# Patient Record
Sex: Female | Born: 1960 | Race: White | Hispanic: No | Marital: Married
Health system: Southern US, Academic
[De-identification: ages and names within clinical notes are randomized; demographics above are authoritative.]

## PROBLEM LIST (undated history)

## (undated) ENCOUNTER — Telehealth

## (undated) ENCOUNTER — Encounter
Attending: Rehabilitative and Restorative Service Providers" | Primary: Rehabilitative and Restorative Service Providers"

## (undated) ENCOUNTER — Ambulatory Visit

## (undated) ENCOUNTER — Encounter
Attending: Student in an Organized Health Care Education/Training Program | Primary: Student in an Organized Health Care Education/Training Program

## (undated) ENCOUNTER — Encounter

## (undated) ENCOUNTER — Ambulatory Visit
Payer: BLUE CROSS/BLUE SHIELD | Attending: Rehabilitative and Restorative Service Providers" | Primary: Rehabilitative and Restorative Service Providers"

## (undated) ENCOUNTER — Encounter: Attending: Adult Health | Primary: Adult Health

## (undated) ENCOUNTER — Ambulatory Visit: Payer: Medicaid (Managed Care)

## (undated) ENCOUNTER — Ambulatory Visit: Payer: PRIVATE HEALTH INSURANCE

## (undated) ENCOUNTER — Ambulatory Visit: Payer: BLUE CROSS/BLUE SHIELD

## (undated) ENCOUNTER — Telehealth: Attending: Ambulatory Care | Primary: Ambulatory Care

## (undated) ENCOUNTER — Encounter: Attending: Family Medicine | Primary: Family Medicine

---
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
## 2006-08-15 ENCOUNTER — Ambulatory Visit: Payer: Self-pay | Admitting: Internal Medicine

## 2011-09-05 ENCOUNTER — Ambulatory Visit: Payer: Self-pay | Admitting: Family Medicine

## 2011-09-06 ENCOUNTER — Ambulatory Visit: Payer: Self-pay | Admitting: Family Medicine

## 2016-01-05 ENCOUNTER — Other Ambulatory Visit (INDEPENDENT_AMBULATORY_CARE_PROVIDER_SITE_OTHER): Payer: Self-pay | Admitting: Vascular Surgery

## 2016-01-05 DIAGNOSIS — I83813 Varicose veins of bilateral lower extremities with pain: Secondary | ICD-10-CM

## 2016-01-06 ENCOUNTER — Encounter (INDEPENDENT_AMBULATORY_CARE_PROVIDER_SITE_OTHER): Payer: Self-pay

## 2016-01-06 ENCOUNTER — Ambulatory Visit (INDEPENDENT_AMBULATORY_CARE_PROVIDER_SITE_OTHER): Payer: BLUE CROSS/BLUE SHIELD

## 2016-01-06 ENCOUNTER — Ambulatory Visit (INDEPENDENT_AMBULATORY_CARE_PROVIDER_SITE_OTHER): Payer: Self-pay | Admitting: Vascular Surgery

## 2016-01-06 DIAGNOSIS — I83813 Varicose veins of bilateral lower extremities with pain: Secondary | ICD-10-CM

## 2016-03-08 ENCOUNTER — Other Ambulatory Visit: Payer: Self-pay | Admitting: Physician Assistant

## 2016-03-08 DIAGNOSIS — R1011 Right upper quadrant pain: Secondary | ICD-10-CM

## 2016-03-15 ENCOUNTER — Ambulatory Visit: Payer: BLUE CROSS/BLUE SHIELD

## 2016-07-20 ENCOUNTER — Emergency Department
Admission: EM | Admit: 2016-07-20 | Discharge: 2016-07-20 | Disposition: A | Discharge status: Home / Self Care | Source: Intra-hospital

## 2016-07-20 ENCOUNTER — Emergency Department
Admission: EM | Admit: 2016-07-20 | Discharge: 2016-07-20 | Disposition: A | Discharge status: Home / Self Care | Source: Intra-hospital | Attending: Emergency Medicine | Admitting: Emergency Medicine

## 2016-07-20 DIAGNOSIS — R1011 Right upper quadrant pain: Principal | ICD-10-CM

## 2016-07-20 MED ORDER — NITROFURANTOIN MONOHYDRATE/MACROCRYSTALS 100 MG CAPSULE
ORAL_CAPSULE | Freq: Two times a day (BID) | ORAL | 0 refills | 0.00000 days | Status: CP
Start: 2016-07-20 — End: 2016-07-27

## 2018-01-01 ENCOUNTER — Ambulatory Visit
Admit: 2018-01-01 | Discharge: 2018-01-01 | Disposition: A | Discharge status: Home / Self Care | Payer: PRIVATE HEALTH INSURANCE

## 2018-01-01 MED ORDER — DICLOFENAC 1 % TOPICAL GEL
Freq: Four times a day (QID) | TOPICAL | 11 refills | 0.00000 days | Status: CP
Start: 2018-01-01 — End: 2018-06-28

## 2018-02-02 ENCOUNTER — Ambulatory Visit: Admit: 2018-02-02 | Discharge: 2018-02-03

## 2018-02-02 DIAGNOSIS — Z1231 Encounter for screening mammogram for malignant neoplasm of breast: Principal | ICD-10-CM

## 2018-06-28 ENCOUNTER — Telehealth: Admit: 2018-06-28 | Discharge: 2018-06-29

## 2018-06-28 DIAGNOSIS — R899 Unspecified abnormal finding in specimens from other organs, systems and tissues: Secondary | ICD-10-CM

## 2018-06-28 DIAGNOSIS — R52 Pain, unspecified: Secondary | ICD-10-CM

## 2018-06-28 DIAGNOSIS — M2560 Stiffness of unspecified joint, not elsewhere classified: Secondary | ICD-10-CM

## 2018-06-28 DIAGNOSIS — M1990 Unspecified osteoarthritis, unspecified site: Principal | ICD-10-CM

## 2018-06-28 DIAGNOSIS — G8929 Other chronic pain: Secondary | ICD-10-CM

## 2018-08-02 ENCOUNTER — Ambulatory Visit: Admit: 2018-08-02 | Discharge: 2018-08-03

## 2018-08-02 DIAGNOSIS — M1990 Unspecified osteoarthritis, unspecified site: Principal | ICD-10-CM

## 2018-08-24 ENCOUNTER — Ambulatory Visit: Admit: 2018-08-24 | Discharge: 2018-08-24

## 2018-08-24 DIAGNOSIS — M1990 Unspecified osteoarthritis, unspecified site: Principal | ICD-10-CM

## 2018-08-24 MED ORDER — METHOTREXATE SODIUM 2.5 MG TABLET
ORAL_TABLET | 2 refills | 0 days | Status: CP
Start: 2018-08-24 — End: ?

## 2018-08-24 MED ORDER — FOLIC ACID 1 MG TABLET
ORAL_TABLET | Freq: Every day | ORAL | 3 refills | 100.00000 days | Status: CP
Start: 2018-08-24 — End: 2019-08-24

## 2018-09-21 ENCOUNTER — Ambulatory Visit: Admit: 2018-09-21 | Discharge: 2018-09-22

## 2018-09-21 DIAGNOSIS — Z79899 Other long term (current) drug therapy: Secondary | ICD-10-CM

## 2018-09-21 DIAGNOSIS — M1990 Unspecified osteoarthritis, unspecified site: Secondary | ICD-10-CM

## 2018-09-21 DIAGNOSIS — Z5181 Encounter for therapeutic drug level monitoring: Secondary | ICD-10-CM

## 2018-10-19 ENCOUNTER — Ambulatory Visit: Admit: 2018-10-19 | Discharge: 2018-10-20

## 2018-10-26 DIAGNOSIS — Z7952 Long term (current) use of systemic steroids: Principal | ICD-10-CM

## 2018-10-31 DIAGNOSIS — M1990 Unspecified osteoarthritis, unspecified site: Principal | ICD-10-CM

## 2018-10-31 MED ORDER — METHOTREXATE SODIUM 2.5 MG TABLET: 20 mg | tablet | 2 refills | 28 days | Status: AC

## 2018-11-20 DIAGNOSIS — M0600 Rheumatoid arthritis without rheumatoid factor, unspecified site: Principal | ICD-10-CM

## 2018-11-20 MED ORDER — HUMIRA SYRINGE CITRATE FREE 40 MG/0.4 ML
INJECTION | SUBCUTANEOUS | 5 refills | 0.00000 days | Status: CP
Start: 2018-11-20 — End: ?

## 2018-11-21 DIAGNOSIS — M0600 Rheumatoid arthritis without rheumatoid factor, unspecified site: Principal | ICD-10-CM

## 2018-12-31 ENCOUNTER — Telehealth: Admit: 2018-12-31 | Discharge: 2018-12-31

## 2018-12-31 ENCOUNTER — Ambulatory Visit: Admit: 2018-12-31 | Discharge: 2018-12-31

## 2018-12-31 DIAGNOSIS — M1990 Unspecified osteoarthritis, unspecified site: Principal | ICD-10-CM

## 2018-12-31 DIAGNOSIS — K2970 Gastritis, unspecified, without bleeding: Principal | ICD-10-CM

## 2018-12-31 MED ORDER — METHOTREXATE SODIUM 2.5 MG TABLET
ORAL_TABLET | ORAL | 2 refills | 28 days | Status: CP
Start: 2018-12-31 — End: ?

## 2018-12-31 MED ORDER — PREDNISONE 5 MG TABLET
ORAL_TABLET | 1 refills | 0 days | Status: CP
Start: 2018-12-31 — End: ?

## 2018-12-31 MED ORDER — PANTOPRAZOLE 40 MG TABLET,DELAYED RELEASE
ORAL_TABLET | Freq: Every day | ORAL | 2 refills | 30.00000 days | Status: CP
Start: 2018-12-31 — End: 2019-12-31

## 2018-12-31 MED ORDER — FOLIC ACID 1 MG TABLET
ORAL_TABLET | Freq: Every day | ORAL | 3 refills | 100 days | Status: CP
Start: 2018-12-31 — End: 2019-12-31

## 2019-01-17 ENCOUNTER — Ambulatory Visit
Admit: 2019-01-17 | Discharge: 2019-01-18 | Payer: PRIVATE HEALTH INSURANCE | Attending: Adult Health | Primary: Adult Health

## 2019-01-17 MED ORDER — HYDROCHLOROTHIAZIDE 25 MG TABLET
ORAL_TABLET | Freq: Every day | ORAL | 1 refills | 90 days | Status: CP
Start: 2019-01-17 — End: ?

## 2019-01-25 DIAGNOSIS — M1990 Unspecified osteoarthritis, unspecified site: Principal | ICD-10-CM

## 2019-01-25 MED ORDER — METHOTREXATE SODIUM 2.5 MG TABLET
ORAL_TABLET | ORAL | 2 refills | 28 days | Status: CP
Start: 2019-01-25 — End: ?

## 2019-03-11 ENCOUNTER — Other Ambulatory Visit: Payer: Self-pay | Admitting: Family Medicine

## 2019-03-11 DIAGNOSIS — Z13820 Encounter for screening for osteoporosis: Secondary | ICD-10-CM

## 2019-03-14 ENCOUNTER — Other Ambulatory Visit: Payer: Self-pay

## 2019-03-14 ENCOUNTER — Other Ambulatory Visit: Payer: Self-pay | Admitting: Internal Medicine

## 2019-03-14 ENCOUNTER — Ambulatory Visit
Admission: RE | Admit: 2019-03-14 | Discharge: 2019-03-14 | Disposition: A | Discharge status: Home / Self Care | Payer: PRIVATE HEALTH INSURANCE | Source: Ambulatory Visit | Attending: Internal Medicine | Admitting: Internal Medicine

## 2019-03-14 DIAGNOSIS — R600 Localized edema: Secondary | ICD-10-CM | POA: Insufficient documentation

## 2019-03-14 DIAGNOSIS — M1380 Other specified arthritis, unspecified site: Secondary | ICD-10-CM

## 2019-03-28 DIAGNOSIS — K2970 Gastritis, unspecified, without bleeding: Principal | ICD-10-CM

## 2019-03-28 MED ORDER — PANTOPRAZOLE 40 MG TABLET,DELAYED RELEASE
ORAL_TABLET | Freq: Every day | ORAL | 2 refills | 30 days | Status: CP
Start: 2019-03-28 — End: 2020-03-27

## 2019-08-19 DIAGNOSIS — M0600 Rheumatoid arthritis without rheumatoid factor, unspecified site: Principal | ICD-10-CM

## 2019-08-19 MED ORDER — EMPTY CONTAINER
2 refills | 0 days
Start: 2019-08-19 — End: ?

## 2019-08-19 MED ORDER — HUMIRA SYRINGE CITRATE FREE 40 MG/0.4 ML
SUBCUTANEOUS | 5 refills | 28.00000 days | Status: CP
Start: 2019-08-19 — End: ?
  Filled 2019-09-06: qty 2, 28d supply, fill #0

## 2019-08-20 DIAGNOSIS — M0600 Rheumatoid arthritis without rheumatoid factor, unspecified site: Principal | ICD-10-CM

## 2019-08-28 NOTE — Unmapped (Signed)
[facility] Specialty Medication Onboarding    Specialty Medication: Humira (CF) Syringe 40mg /0.53ml every 2 weeks  Prior Authorization: Approved   Financial Assistance: Yes - copay card approved as secondary   Final Copay/Day Supply: $5 / 28 days    Insurance Restrictions: Yes - max 1 month supply     Notes to Pharmacist:     The triage team has completed the benefits investigation and has determined that the patient is able to fill this medication at [facility]. Please contact the patient to complete the onboarding or follow up with the prescribing physician as needed.

## 2019-08-29 NOTE — Unmapped (Signed)
I reviewed this patient case and all documentation provided by the learner and was readily available for consultation during their interaction with the patient.  I agree with the assessment and plan listed below.    Leah Bolton, PharmD, BCPS  [facility] Specialty Pharmacist        [facility]   Patient Onboarding/Medication Counseling    Leah Bolton is a 59 y.o. female with seronegative rheumatoid arthritis who I am counseling today on continuation of therapy.  I am speaking to the patient.    Was a Nurse, learning disability used for this call? No    Verified patient's date of birth / HIPAA.    Specialty medication(s) to be sent: Inflammatory Disorders: Humira      Non-specialty medications/supplies to be sent: Sharps container      Medications not needed at this time: N/A         Humira (adalimumab)    Medication & Administration     Dosage: Rheumatoid arthritis: Inject 40mg  under the skin every 14 days    Lab tests required prior to treatment initiation:  ??? Tuberculosis: Tuberculosis screening resulted in a non-reactive Quantiferon TB Gold assay.  ??? Hepatitis B: Hepatitis B serology studies are complete and non-reactive.    Administration:     Prefilled syringe  1. Gather all supplies needed for injection on a clean, flat working surface: medication syringe(s) removed from packaging, alcohol swab, sharps container, etc.  2. Look at the medication label - look for correct medication, correct dose, and check the expiration date  3. Look at the medication - the liquid in the syringe should appear clear and colorless  4. Lay the syringe on a flat surface and allow it to warm up to room temperature for at least 30-45 minutes  5. Select injection site - you can use the front of your thigh or your belly (but not the area 2 inches around your belly button)  6. Prepare injection site - wash your hands and clean the skin at the injection site with an alcohol swab and let it air dry, do not touch the injection site again before the injection  7. Pull off the needle safety cap, do not remove until immediately prior to injection; turn the syringe so the needle is facing up and hold the syringe at eye level with one hand so you can see the air in the syringe; using your other hand, slowly push the plunger in to push the air out through the needle  8. Pinch the skin - with your hand not holding the syringe pinch up a fold of skin at the injection site using your forefinger and thumb  9. Insert the needle into the fold of skin at about a 45 degree angle - it's best to use a quick dart-like motion  10.Push the plunger down slowly as far as it will go until the syringe is empty, hold the syringe in place for a full 5 seconds  11. Check that the syringe is empty and pull the needle out at the same angle as inserted  12. Dispose of the used syringe immediately in your sharps disposal container, do not attempt to recap the needle prior to disposing  13. If you see any blood at the injection site, press a cotton ball or gauze on the site and maintain pressure until the bleeding stops, do not rub the injection site    Adherence/Missed dose instructions:  If your injection  is given more than 3 days after your scheduled injection date ??? consult your pharmacist for additional instructions on how to adjust your dosing schedule.    Goals of Therapy     - Achieve symptom remission  - Slow disease progression  - Protection of remaining articular structures  - Maintenance of function  - Maintenance of effective psychosocial functioning    Side Effects & Monitoring Parameters     ??? Injection site reaction (redness, irritation, inflammation localized to the site of administration)  ??? Signs of a common cold ??? minor sore throat, runny or stuffy nose, etc.  ??? Upset stomach  ??? Headache    The following side effects should be reported to the provider:  ??? Signs of a hypersensitivity reaction ??? rash; hives; itching; red, swollen, blistered, or peeling skin; wheezing; tightness in the chest or throat; difficulty breathing, swallowing, or talking; swelling of the mouth, face, lips, tongue, or throat; etc.  ??? Reduced immune function ??? report signs of infection such as fever; chills; body aches; very bad sore throat; ear or sinus pain; cough; more sputum or change in color of sputum; pain with passing urine; wound that will not heal, etc.  Also at a slightly higher risk of some malignancies (mainly skin and blood cancers) due to this reduced immune function.  o In the case of signs of infection ??? the patient should hold the next dose of Humira?? and call your primary care provider to ensure adequate medical care.  Treatment may be resumed when infection is treated and patient is asymptomatic.  ??? Changes in skin ??? a new growth or lump that forms; changes in shape, size, or color of a previous mole or marking  ??? Signs of unexplained bruising or bleeding ??? throwing up blood or emesis that looks like coffee grounds; black, tarry, or bloody stool; etc.  ??? Signs of new or worsening heart failure ??? shortness of breath; sudden weight gain; heartbeat that is not normal; swelling in the arms or legs that is new or worse      Contraindications, Warnings, & Precautions     ??? Have your bloodwork checked as you have been told by your prescriber  ??? Talk with your doctor if you are pregnant, planning to become pregnant, or breastfeeding  ??? Discuss the possible need for holding your dose(s) of Humira?? when a planned procedure is scheduled with the prescriber as it may delay healing/recovery timeline       Drug/Food Interactions     ??? Medication list reviewed in Epic. The patient was instructed to inform the care team before taking any new medications or supplements. No drug interactions identified.   ??? Talk with you prescriber or pharmacist before receiving any live vaccinations while taking this medication and after you stop taking it    Storage, Handling Precautions, & Disposal     ??? Store this medication in the refrigerator.  Do not freeze  ??? If needed, you may store at room temperature for up to 14 days  ??? Store in original packaging, protected from light  ??? Do not shake  ??? Dispose of used syringes/pens in a sharps disposal container            Current Medications (including OTC/herbals), Comorbidities and Allergies     Current Outpatient Medications   Medication Sig Dispense Refill   ??? calcium carbonate (OS-CAL) 600 mg (1,500 mg) tablet Take 1 tablet by mouth daily.     ??? cetirizine (  ZYRTEC) 10 MG tablet Take 10 mg by mouth daily.     ??? folic acid (FOLVITE) 1 MG tablet Take 1 tablet (1 mg total) by mouth daily. 100 tablet 3   ??? HUMIRA SYRINGE CITRATE FREE 40 MG/0.4 ML Inject the contents of 1 syringe (40 mg total) under the skin every fourteen (14) days. 2 each 5   ??? hydroCHLOROthiazide (HYDRODIURIL) 25 MG tablet Take 1 tablet (25 mg total) by mouth daily. 90 tablet 1   ??? metFORMIN (GLUCOPHAGE) 500 MG tablet Take 1,000 mg by mouth 2 (two) times a day with meals.      ??? methotrexate 2.5 MG tablet Take 8 tablets (20 mg total) by mouth once a week. 32 tablet 2   ??? pantoprazole (PROTONIX) 40 MG tablet Take 1 tablet (40 mg total) by mouth daily. 30 tablet 2   ??? predniSONE (DELTASONE) 5 MG tablet After the second humira dose, take Prednisone 7.5mg  (1.5 tabs) daily for 1 month, then take 5mg  (1 tab) daily until follow-up. 120 tablet 1   ??? triamcinolone (KENALOG) 0.1 % cream Apply topically daily as needed.        No current facility-administered medications for this visit.       No Known Allergies    Patient Active Problem List   Diagnosis   ??? Osteoarthritis of right knee   ??? Status post right knee replacement   ??? Diabetes mellitus, type 2 (CMS-HCC)       Reviewed and up to date in Epic.    Appropriateness of Therapy     Is medication and dose appropriate based on diagnosis? Yes    Prescription has been clinically reviewed: Yes    Baseline Quality of Life Assessment      Rheumatology:   Quality of Life    On a scale of 1 ??? 10 with 1 representing not at all and 10 representing completely ??? how has your rheumatologic condition affected your:  Daily pain level?: 1  Ability to complete your regular daily tasks (prepare meals, get dressed, etc.)?: 1  Ability to participate in social or family activities?: decline to answer         Financial Information     Medication Assistance provided: Prior Authorization and Copay Assistance    Anticipated copay of $5 / 28days reviewed with patient. Verified delivery address.    Delivery Information     Scheduled delivery date: 8/27    Expected start date: 8/31    Medication will be delivered via Same Day Courier to the prescription address in [facility].  This shipment will not require a signature.      Explained the services we provide at [facility] and that each month we would call to set up refills.  Stressed importance of returning phone calls so that we could ensure they receive their medications in time each month.  Informed patient that we should be setting up refills 7-10 days prior to when they will run out of medication.  A pharmacist will reach out to perform a clinical assessment periodically.  Informed patient that a welcome packet and a drug information handout will be sent.      Patient verbalized understanding of the above information as well as how to contact the pharmacy at [phone] option 4 with any questions/concerns.  The pharmacy is open Monday through Friday 8:30am-4:30pm.  A pharmacist is available 24/7 via pager to answer any clinical questions they may have.  Patient Specific Needs     - Does the patient have any physical, cognitive, or cultural barriers? No    - Patient prefers to have medications discussed with  Patient     - Is the patient or caregiver able to read and understand education materials at a high school level or above? No    - Patient's primary language is  English - Is the patient high risk? No    - Does the patient require a Care Management Plan? No     - Does the patient require physician intervention or other additional services (i.e. nutrition, smoking cessation, social work)? No      Leah Bolton  [facility] Specialty Pharmacist

## 2019-09-06 MED FILL — HUMIRA SYRINGE CITRATE FREE 40 MG/0.4 ML: 28 days supply | Qty: 2 | Fill #0 | Status: AC

## 2019-09-06 MED FILL — EMPTY CONTAINER: 120 days supply | Qty: 1 | Fill #0

## 2019-09-06 MED FILL — EMPTY CONTAINER: 120 days supply | Qty: 1 | Fill #0 | Status: AC

## 2019-09-27 NOTE — Unmapped (Signed)
[facility] Pharm[facility] Clinical Assessment & Refill Coordination Note    Devita Nies [facility], DOB: Jun 13, 1960  Phone: [phone] (home)     All above HIPAA information was verified with patient.     Was a Nurse, learning disability used for this call? No    Specialty Medication(s):   Inflammatory Disorders: Humira     Current Outpatient Medications   Medication Sig Dispense Refill   ??? calcium carbonate (OS-CAL) 600 mg (1,500 mg) tablet Take 1 tablet by mouth daily.     ??? cetirizine (ZYRTEC) 10 MG tablet Take 10 mg by mouth daily.     ??? empty container Misc Use as directed 1 each 2   ??? folic acid (FOLVITE) 1 MG tablet Take 1 tablet (1 mg total) by mouth daily. 100 tablet 3   ??? HUMIRA SYRINGE CITRATE FREE 40 MG/0.4 ML Inject the contents of 1 syringe (40 mg total) under the skin every fourteen (14) days. 2 each 5   ??? hydroCHLOROthiazide (HYDRODIURIL) 25 MG tablet Take 1 tablet (25 mg total) by mouth daily. 90 tablet 1   ??? metFORMIN (GLUCOPHAGE) 500 MG tablet Take 1,000 mg by mouth 2 (two) times a day with meals.      ??? methotrexate 2.5 MG tablet Take 8 tablets (20 mg total) by mouth once a week. 32 tablet 2   ??? pantoprazole (PROTONIX) 40 MG tablet Take 1 tablet (40 mg total) by mouth daily. 30 tablet 2   ??? predniSONE (DELTASONE) 5 MG tablet After the second humira dose, take Prednisone 7.5mg  (1.5 tabs) daily for 1 month, then take 5mg  (1 tab) daily until follow-up. 120 tablet 1   ??? triamcinolone (KENALOG) 0.1 % cream Apply topically daily as needed.        No current facility-administered medications for this visit.        Changes to medications: Deven reports no changes at this time.    No Known Allergies    Changes to allergies: No    SPECIALTY MEDICATION ADHERENCE     Humira 40mg /0.47ml: 0 days of medicine on hand     Medication Adherence    Patient reported X missed doses in the last month: 0  Specialty Medication: Humira 40mg /0.24ml          Specialty medication(s) dose(s) confirmed: Regimen is correct and unchanged.     Are there any concerns with adherence? No    Adherence counseling provided? Not needed    CLINICAL MANAGEMENT AND INTERVENTION      Clinical Benefit Assessment:    Do you feel the medicine is effective or helping your condition? Yes    Clinical Benefit counseling provided? Not needed    Adverse Effects Assessment:    Are you experiencing any side effects? No    Are you experiencing difficulty administering your medicine? No    Quality of Life Assessment:    Rheumatology:   Quality of Life    On a scale of 1 ??? 10 with 1 representing not at all and 10 representing completely ??? how has your rheumatologic condition affected your:  Daily pain level?: decline to answer  Ability to complete your regular daily tasks (prepare meals, get dressed, etc.)?: decline to answer  Ability to participate in social or family activities?: decline to answer         Have you discussed this with your provider? Not needed    Therapy Appropriateness:    Is therapy appropriate? Yes, therapy is appropriate and should  be continued    DISEASE/MEDICATION-SPECIFIC INFORMATION      For patients on injectable medications: Patient currently has 0 doses left.  Next injection is scheduled for 10/08/2019.    PATIENT SPECIFIC NEEDS     - Does the patient have any physical, cognitive, or cultural barriers? No    - Is the patient high risk? No    - Does the patient require a Care Management Plan? No     - Does the patient require physician intervention or other additional services (i.e. nutrition, smoking cessation, social work)? No      SHIPPING     Specialty Medication(s) to be Shipped:   Inflammatory Disorders: Humira 40mg /0.5ml    Other medication(s) to be shipped: No additional medications requested for fill at this time     Changes to insurance: No    Delivery Scheduled: Yes, Expected medication delivery date: 10/04/2019.     Medication will be delivered via Same Day Courier to the confirmed prescription address in [facility].    The patient will receive a drug information handout for each medication shipped and additional FDA Medication Guides as required.  Verified that patient has previously received a Conservation officer, historic buildings.    All of the patient's questions and concerns have been addressed.    Karene Fry Keonia Pasko   [facility] Specialty Pharmacist

## 2019-10-04 MED FILL — HUMIRA SYRINGE CITRATE FREE 40 MG/0.4 ML: SUBCUTANEOUS | 28 days supply | Qty: 2 | Fill #1

## 2019-10-04 MED FILL — HUMIRA SYRINGE CITRATE FREE 40 MG/0.4 ML: 28 days supply | Qty: 2 | Fill #1 | Status: AC

## 2019-10-25 NOTE — Unmapped (Signed)
[facility] Pharmacy Refill Coordination Note    Specialty Medication(s) to be Shipped:   Inflammatory Disorders: Humira    Other medication(s) to be shipped: No additional medications requested for fill at this time     Leah Bolton, DOB: [birth year]  Phone: [phone] (home)       All above HIPAA information was verified with patient.     Was a Nurse, learning disability used for this call? No    Completed refill call assessment today to schedule patient's medication shipment from the [facility] [phone]).       Specialty medication(s) and dose(s) confirmed: Regimen is correct and unchanged.   Changes to medications: [name] reports no changes at this time.  Changes to insurance: No  Questions for the pharmacist: No    Confirmed patient received Welcome Packet with first shipment. The patient will receive a drug information handout for each medication shipped and additional FDA Medication Guides as required.       DISEASE/MEDICATION-SPECIFIC INFORMATION        For patients on injectable medications: Patient currently has 0 doses left.  Next injection is scheduled for 10/26.    SPECIALTY MEDICATION ADHERENCE     Medication Adherence    Patient reported X missed doses in the last month: 0  Specialty Medication: Humira  Patient is on additional specialty medications: No  Patient is on more than two specialty medications: No  Any gaps in refill history greater than 2 weeks in the last 3 months: no  Demonstrates understanding of importance of adherence: yes  Informant: patient                Humira 40mg /0.74ml: Patient has 0 days of medication on hand      SHIPPING     Shipping address confirmed in Epic.     Delivery Scheduled: Yes, Expected medication delivery date: 10/22.     Medication will be delivered via Same Day Courier to the prescription address in Epic WAM.    [name]   [facility] Specialty Technician

## 2019-11-01 MED FILL — HUMIRA SYRINGE CITRATE FREE 40 MG/0.4 ML: 28 days supply | Qty: 2 | Fill #2 | Status: AC

## 2019-11-01 MED FILL — HUMIRA SYRINGE CITRATE FREE 40 MG/0.4 ML: SUBCUTANEOUS | 28 days supply | Qty: 2 | Fill #2

## 2019-11-22 NOTE — Unmapped (Signed)
[facility] Pharm[facility] Refill Coordination Note    Specialty Medication(s) to be Shipped:   Inflammatory Disorders: Humira    Other medication(s) to be shipped: No additional medications requested for fill at this time     Leah Bolton, DOB: Oct 13, 1960  Phone: [phone] (home)       All above HIPAA information was verified with patient.     Was a Nurse, learning disability used for this call? No    Completed refill call assessment today to schedule patient's medication shipment from the [facility] [phone]).       Specialty medication(s) and dose(s) confirmed: Regimen is correct and unchanged.   Changes to medications: [name] reports no changes at this time.  Changes to insurance: No  Questions for the pharmacist: No    Confirmed patient received Welcome Packet with first shipment. The patient will receive a drug information handout for each medication shipped and additional FDA Medication Guides as required.       DISEASE/MEDICATION-SPECIFIC INFORMATION        For patients on injectable medications: Patient currently has 0 doses left.  Next injection is scheduled for 11/23.    SPECIALTY MEDICATION ADHERENCE     Medication Adherence    Patient reported X missed doses in the last month: 0  Specialty Medication: Humira  Patient is on additional specialty medications: No  Patient is on more than two specialty medications: No  Any gaps in refill history greater than 2 weeks in the last 3 months: no  Demonstrates understanding of importance of adherence: yes  Informant: patient                Humira 40mg /0.39ml: Patient has 0 days of medication on hand      SHIPPING     Shipping address confirmed in Epic.     Delivery Scheduled: Yes, Expected medication delivery date: 11/19.     Medication will be delivered via Same Day Courier to the prescription address in Epic WAM.    [name]   [facility] Specialty Technician

## 2019-11-29 MED FILL — HUMIRA SYRINGE CITRATE FREE 40 MG/0.4 ML: 28 days supply | Qty: 2 | Fill #3 | Status: AC

## 2019-11-29 MED FILL — HUMIRA SYRINGE CITRATE FREE 40 MG/0.4 ML: SUBCUTANEOUS | 28 days supply | Qty: 2 | Fill #3

## 2019-12-16 ENCOUNTER — Other Ambulatory Visit: Payer: Self-pay | Admitting: Family Medicine

## 2019-12-16 DIAGNOSIS — Z1231 Encounter for screening mammogram for malignant neoplasm of breast: Secondary | ICD-10-CM

## 2019-12-20 NOTE — Unmapped (Signed)
[facility] Special[facility] Pharm[facility] Refill Coordination Note    Specialty Medication(s) to be Shipped:   Inflammatory Disorders: Humira    Other medication(s) to be shipped: No additional medications requested for fill at this time     Leah Bolton, DOB: 12-11-1960  Phone: [phone] (home)       All above HIPAA information was verified with patient.     Was a Nurse, learning disability used for this call? No    Completed refill call assessment today to schedule patient's medication shipment from the [facility] [phone]).       Specialty medication(s) and dose(s) confirmed: Regimen is correct and unchanged.   Changes to medications: Saraphina reports starting the following medications: prednisone  Changes to insurance: No  Questions for the pharmacist: No    Confirmed patient received Welcome Packet with first shipment. The patient will receive a drug information handout for each medication shipped and additional FDA Medication Guides as required.       DISEASE/MEDICATION-SPECIFIC INFORMATION        For patients on injectable medications: Patient currently has 0 doses left.  Next injection is scheduled for 12/21.    SPECIALTY MEDICATION ADHERENCE     Medication Adherence    Patient reported X missed doses in the last month: 0  Specialty Medication: Humira 40mg /0.60ml  Patient is on additional specialty medications: No  Patient is on more than two specialty medications: No  Any gaps in refill history greater than 2 weeks in the last 3 months: no  Demonstrates understanding of importance of adherence: yes  Informant: patient  Reliability of informant: reliable  Provider-estimated medication adherence level: good  Patient is at risk for Non-Adherence: No                  Humira  40/0.4 mg/ml: 0 days of medicine on hand       SHIPPING     Shipping address confirmed in Epic.     Delivery Scheduled: Yes, Expected medication delivery date: 12/16.     Medication will be delivered via Same Day Courier to the prescription address in Epic WAM.    Antonietta Barcelona   [facility] Specialty Technician

## 2019-12-26 MED FILL — HUMIRA SYRINGE CITRATE FREE 40 MG/0.4 ML: SUBCUTANEOUS | 28 days supply | Qty: 2 | Fill #4

## 2019-12-26 MED FILL — HUMIRA SYRINGE CITRATE FREE 40 MG/0.4 ML: 28 days supply | Qty: 2 | Fill #4 | Status: AC

## 2020-01-09 ENCOUNTER — Other Ambulatory Visit: Payer: Self-pay

## 2020-01-09 ENCOUNTER — Inpatient Hospital Stay
Admission: RE | Admit: 2020-01-09 | Discharge: 2020-01-09 | Disposition: A | Discharge status: Home / Self Care | Payer: Self-pay | Source: Ambulatory Visit | Attending: *Deleted | Admitting: *Deleted

## 2020-01-09 ENCOUNTER — Other Ambulatory Visit: Payer: Self-pay | Admitting: *Deleted

## 2020-01-09 ENCOUNTER — Ambulatory Visit
Admission: RE | Admit: 2020-01-09 | Discharge: 2020-01-09 | Disposition: A | Discharge status: Home / Self Care | Payer: PRIVATE HEALTH INSURANCE | Source: Ambulatory Visit | Attending: Family Medicine | Admitting: Family Medicine

## 2020-01-09 DIAGNOSIS — Z1231 Encounter for screening mammogram for malignant neoplasm of breast: Secondary | ICD-10-CM

## 2020-01-16 NOTE — Unmapped (Signed)
[facility] Pharmacy Refill Coordination Note    Specialty Medication(s) to be Shipped:   Inflammatory Disorders: Humira    Other medication(s) to be shipped: No additional medications requested for fill at this time     Leah Bolton, DOB: 1960-10-27  Phone: [phone] (home)       All above HIPAA information was verified with patient.     Was a Nurse, learning disability used for this call? No    Completed refill call assessment today to schedule patient's medication shipment from the [facility] [phone]).       Specialty medication(s) and dose(s) confirmed: Regimen is correct and unchanged.   Changes to medications: Leah Bolton reports no changes at this time.  Changes to insurance: No  Questions for the pharmacist: No    Confirmed patient received Welcome Packet with first shipment. The patient will receive a drug information handout for each medication shipped and additional FDA Medication Guides as required.       DISEASE/MEDICATION-SPECIFIC INFORMATION        For patients on injectable medications: Patient currently has 0 doses left.  Next injection is scheduled for 1/18.    SPECIALTY MEDICATION ADHERENCE     Medication Adherence    Patient reported X missed doses in the last month: 0  Specialty Medication: Humira  Patient is on additional specialty medications: No  Patient is on more than two specialty medications: No  Any gaps in refill history greater than 2 weeks in the last 3 months: no  Demonstrates understanding of importance of adherence: yes  Informant: patient                Humira 40mg /0.81ml: Patient has 0 days of medication on hand      SHIPPING     Shipping address confirmed in Epic.     Delivery Scheduled: Yes, Expected medication delivery date: 1/13.     Medication will be delivered via Same Day Courier to the prescription address in Epic WAM.    Leah Bolton   [facility] Specialty Technician

## 2020-01-23 DIAGNOSIS — M0600 Rheumatoid arthritis without rheumatoid factor, unspecified site: Principal | ICD-10-CM

## 2020-01-23 MED ORDER — HUMIRA SYRINGE CITRATE FREE 40 MG/0.4 ML
SUBCUTANEOUS | 1 refills | 0 days
Start: 2020-01-23 — End: ?

## 2020-01-23 NOTE — Unmapped (Signed)
I called and spoke with Leah Bolton and confirmed with her we would be unable to pursue manufacturer assistance since the prescription we currently have on file is not from her current physician.  She voiced understanding and will request her new rheumatologist to send [place] a prescription - at that point we will re-initiate a referral for financial assistance.    Leah Bolton declines refilling her medication until she has new insurance or is able to complete the process for manufacturer assistance with her new provider.    Rescheduling Leah Bolton's next specialty pharmacy outreach call appropriately - the patient voiced understanding that they are to call us back at the [facility] if they need anything between now and then.

## 2020-01-23 NOTE — Unmapped (Signed)
Delonda Coley [facility] 's Humira shipment will be canceled  as a result of patient no longer being seen by [facility]- at the recommendation of [facility].      We will not reschedule the medication and have removed this/these medication(s) from the work request.  We have canceled this work request.

## 2020-01-23 NOTE — Unmapped (Signed)
Glendine Swetz [facility] 's Humira shipment will be canceled  as a result of the patient's insurance being terminated.      I have reached out to the patient and communicated the delay. We will not reschedule the medication and have removed this/these medication(s) from the work request.  We have canceled this work request.        [facility] will route information to MAPs to see if patient qualifies for [facility] assistance while she waits on Medicaid.

## 2020-01-28 NOTE — Unmapped (Signed)
I called Mrs Kizziah about her Humira prescription. If we are going to continue to fill we will need to see her once a year. She is trying to find out if her [facility] provider can order this for her.     Janae Bridgeman, MD  [facility]   Pager [phone]  01/28/2020

## 2020-01-29 DIAGNOSIS — M0609 Rheumatoid arthritis without rheumatoid factor, multiple sites: Principal | ICD-10-CM

## 2020-03-02 NOTE — Unmapped (Signed)
The [facility] has made a third and final attempt to reach this patient to refill the following medication: Humira 40mg /0.5ml.      We have left voicemails on the following phone numbers: [phone].    Dates contacted: 02/21/2020; 02/25/2020; 03/02/2020  Last scheduled delivery: 12/26/2019    Of note - the patient is now being managed by a rheumatologist outside of [facility].  Per the patients conversation with me on 02/21/2020 they have obtained new prescription insurance coverage and would be calling us back to provide that information to allow [place] to assist with medication approval.  At this time the patient has not returned my subsequent outreach attempts to provide [place] with the needed information to proceed.    The patient may be at risk of non-compliance with this medication. The patient should call the [facility] at [phone] (option 4) to refill medication.    Karene Fry Gladies Sofranko   [facility] Specialty Pharmacist

## 2020-05-27 NOTE — Unmapped (Signed)
Specialty Medication(s): Humira    Ms.Howson has been dis-enrolled from the [facility] specialty pharmacy services due to multiple unsuccessful outreach attempts by the pharmacy.        Karene Fry Shown Dissinger  [facility] [employer] Specialty Pharmacist

## 2020-12-25 ENCOUNTER — Ambulatory Visit: Admit: 2020-12-25 | Discharge: 2020-12-25 | Payer: BLUE CROSS/BLUE SHIELD

## 2021-03-23 ENCOUNTER — Ambulatory Visit: Admit: 2021-03-23 | Discharge: 2021-03-24 | Payer: BLUE CROSS/BLUE SHIELD

## 2021-04-14 ENCOUNTER — Ambulatory Visit: Admit: 2021-04-14 | Discharge: 2021-04-15 | Payer: BLUE CROSS/BLUE SHIELD

## 2021-05-31 ENCOUNTER — Ambulatory Visit: Admit: 2021-05-31 | Discharge: 2021-06-01 | Payer: BLUE CROSS/BLUE SHIELD

## 2021-05-31 ENCOUNTER — Ambulatory Visit
Admit: 2021-05-31 | Discharge: 2021-06-01 | Payer: BLUE CROSS/BLUE SHIELD | Attending: Nurse Practitioner | Primary: Nurse Practitioner

## 2021-05-31 DIAGNOSIS — I872 Venous insufficiency (chronic) (peripheral): Principal | ICD-10-CM

## 2021-05-31 DIAGNOSIS — I890 Lymphedema, not elsewhere classified: Principal | ICD-10-CM

## 2021-09-28 ENCOUNTER — Other Ambulatory Visit: Payer: Self-pay

## 2021-09-28 ENCOUNTER — Emergency Department
Admission: EM | Admit: 2021-09-28 | Discharge: 2021-09-28 | Disposition: A | Discharge status: Home / Self Care | Payer: Medicaid Other | Attending: Emergency Medicine | Admitting: Emergency Medicine

## 2021-09-28 ENCOUNTER — Emergency Department: Payer: Medicaid Other

## 2021-09-28 DIAGNOSIS — N200 Calculus of kidney: Secondary | ICD-10-CM | POA: Insufficient documentation

## 2021-09-28 DIAGNOSIS — D72819 Decreased white blood cell count, unspecified: Secondary | ICD-10-CM | POA: Diagnosis not present

## 2021-09-28 DIAGNOSIS — B9689 Other specified bacterial agents as the cause of diseases classified elsewhere: Secondary | ICD-10-CM | POA: Diagnosis not present

## 2021-09-28 DIAGNOSIS — E119 Type 2 diabetes mellitus without complications: Secondary | ICD-10-CM | POA: Diagnosis not present

## 2021-09-28 DIAGNOSIS — N390 Urinary tract infection, site not specified: Secondary | ICD-10-CM | POA: Insufficient documentation

## 2021-09-28 DIAGNOSIS — N201 Calculus of ureter: Secondary | ICD-10-CM

## 2021-09-28 DIAGNOSIS — R109 Unspecified abdominal pain: Secondary | ICD-10-CM | POA: Diagnosis present

## 2021-09-28 LAB — CBC WITH DIFFERENTIAL/PLATELET
Abs Immature Granulocytes: 0.01 10*3/uL (ref 0.00–0.07)
Basophils Absolute: 0.1 10*3/uL (ref 0.0–0.1)
Basophils Relative: 1 %
Eosinophils Absolute: 0.3 10*3/uL (ref 0.0–0.5)
Eosinophils Relative: 5 %
HCT: 36.1 % (ref 36.0–46.0)
Hemoglobin: 10.8 g/dL — ABNORMAL LOW (ref 12.0–15.0)
Immature Granulocytes: 0 %
Lymphocytes Relative: 41 %
Lymphs Abs: 2.6 10*3/uL (ref 0.7–4.0)
MCH: 23.8 pg — ABNORMAL LOW (ref 26.0–34.0)
MCHC: 29.9 g/dL — ABNORMAL LOW (ref 30.0–36.0)
MCV: 79.7 fL — ABNORMAL LOW (ref 80.0–100.0)
Monocytes Absolute: 0.6 10*3/uL (ref 0.1–1.0)
Monocytes Relative: 9 %
Neutro Abs: 2.8 10*3/uL (ref 1.7–7.7)
Neutrophils Relative %: 44 %
Platelets: 292 10*3/uL (ref 150–400)
RBC: 4.53 MIL/uL (ref 3.87–5.11)
RDW: 15.4 % (ref 11.5–15.5)
WBC: 6.3 10*3/uL (ref 4.0–10.5)
nRBC: 0 % (ref 0.0–0.2)

## 2021-09-28 LAB — URINALYSIS, ROUTINE W REFLEX MICROSCOPIC
Bilirubin Urine: NEGATIVE
Glucose, UA: NEGATIVE mg/dL
Ketones, ur: NEGATIVE mg/dL
Nitrite: NEGATIVE
Protein, ur: 30 mg/dL — AB
Specific Gravity, Urine: 1.027 (ref 1.005–1.030)
pH: 5 (ref 5.0–8.0)

## 2021-09-28 LAB — COMPREHENSIVE METABOLIC PANEL
ALT: 15 U/L (ref 0–44)
AST: 24 U/L (ref 15–41)
Albumin: 3.6 g/dL (ref 3.5–5.0)
Alkaline Phosphatase: 101 U/L (ref 38–126)
Anion gap: 8 (ref 5–15)
BUN: 27 mg/dL — ABNORMAL HIGH (ref 6–20)
CO2: 28 mmol/L (ref 22–32)
Calcium: 9.1 mg/dL (ref 8.9–10.3)
Chloride: 105 mmol/L (ref 98–111)
Creatinine, Ser: 1.03 mg/dL — ABNORMAL HIGH (ref 0.44–1.00)
GFR, Estimated: >60 mL/min (ref >60)
Glucose, Bld: 116 mg/dL — ABNORMAL HIGH (ref 70–99)
Potassium: 4 mmol/L (ref 3.5–5.1)
Sodium: 141 mmol/L (ref 135–145)
Total Bilirubin: 0.7 mg/dL (ref 0.3–1.2)
Total Protein: 7.6 g/dL (ref 6.5–8.1)

## 2021-09-28 LAB — LIPASE, BLOOD: Lipase: 48 U/L (ref 11–51)

## 2021-09-28 MED ORDER — KETOROLAC TROMETHAMINE 15 MG/ML IJ SOLN
15.0000 mg | Freq: Once | INTRAMUSCULAR | Status: AC
  Administered 2021-09-28: 15 mg via INTRAVENOUS
  Filled 2021-09-28: qty 1

## 2021-09-28 MED ORDER — HYDROCODONE-ACETAMINOPHEN 5-325 MG PO TABS: 1.0000 | ORAL_TABLET | ORAL | 0 refills | Status: AC | PRN

## 2021-09-28 MED ORDER — ONDANSETRON HCL 4 MG/2ML IJ SOLN
4.0000 mg | Freq: Once | INTRAMUSCULAR | Status: AC
  Administered 2021-09-28: 4 mg via INTRAVENOUS
  Filled 2021-09-28: qty 2

## 2021-09-28 MED ORDER — SODIUM CHLORIDE 0.9 % IV SOLN
1.0000 g | Freq: Once | INTRAVENOUS | Status: AC
  Administered 2021-09-28: 1 g via INTRAVENOUS
  Filled 2021-09-28: qty 10

## 2021-09-28 MED ORDER — CEFPODOXIME PROXETIL 200 MG PO TABS: 200.0000 mg | ORAL_TABLET | Freq: Two times a day (BID) | ORAL | 0 refills | Status: AC

## 2021-09-28 NOTE — ED Triage Notes (Signed)
Pt here from [facility] with L flank pain and nausea that started at 0800. Pt having chills and sweats at times.

## 2021-09-28 NOTE — Discharge Instructions (Addendum)
Take the antibiotic as prescribed and finish the full 7-day course.  Take the hydrocodone as needed for pain.  Follow-up with the urologist within the next week.  Return to the ER for new, worsening, or persistent severe pain, fever, vomiting or if you cannot take the antibiotic, or any other new or worsening symptoms that concern you.

## 2021-09-28 NOTE — ED Triage Notes (Addendum)
First Nurse Note:  Pt via [place] from [facility]. Pt c/o sudden onset of L sided abd pain, nausea, weakness, chill, cold sweats. Pt was going to [facility] for routine check up. Pt is A&Ox4 and NAD

## 2021-09-28 NOTE — ED Notes (Signed)
AVS with prescriptions provided to and discussed with patient. Pt verbalizes understanding of discharge instructions and denies any questions or concerns at this time. Pt ambulated out of department using cane. No distress noted.

## 2021-09-28 NOTE — ED Provider Notes (Signed)
[facility] Provider Note    Event Date/Time   First MD Initiated Contact with Patient 09/28/21 [phone]     (approximate)   History   Nausea and Flank Pain   HPI  Tracy Anthony is a 61 y.o. female with a history of diabetes, rheumatoid arthritis, and lymphedema who presents with acute onset of left flank pain this morning associated with nausea and lightheadedness but no vomiting.  She denies any abdominal pain.  She has no diarrhea but does endorse some mild dysuria just this morning.    Physical Exam   Triage Vital Signs: ED Triage Vitals  Enc Vitals Group     BP 09/28/21 0842 103/73     Pulse Rate 09/28/21 0842 63     Resp 09/28/21 0842 18     Temp 09/28/21 0842 (!) 97.5 F (36.4 C)     Temp Source 09/28/21 0842 Oral     SpO2 09/28/21 0842 96 %     Weight 09/28/21 0840 (!) 310 lb (140.6 kg)     Height 09/28/21 0840 5\' 11"  (1.803 m)     Head Circumference --      Peak Flow --      Pain Score 09/28/21 0840 10     Pain Loc --      Pain Edu? --      Excl. in [place]? --     Most recent vital signs: Vitals:   09/28/21 0930 09/28/21 1030  BP: 116/70 (!) 117/57  Pulse: 60 64  Resp: 16 15  Temp:  97.9 F (36.6 C)  SpO2: 93% 97%     General: Awake, no distress.  CV:  Good peripheral perfusion.  Resp:  Normal effort.  Abd:  Soft and nontender.  No distention.  Other:  No CVA tenderness.   ED Results / Procedures / Treatments   Labs (all labs ordered are listed, but only abnormal results are displayed) Labs Reviewed  CBC WITH DIFFERENTIAL/PLATELET - Abnormal; Notable for the following components:      Result Value   Hemoglobin 10.8 (*)    MCV 79.7 (*)    MCH 23.8 (*)    MCHC 29.9 (*)    All other components within normal limits  URINALYSIS, ROUTINE W REFLEX MICROSCOPIC - Abnormal; Notable for the following components:   Color, Urine YELLOW (*)    APPearance CLOUDY (*)    Hgb urine dipstick SMALL (*)    Protein, ur 30 (*)     Leukocytes,Ua MODERATE (*)    Bacteria, UA FEW (*)    Non Squamous Epithelial PRESENT (*)    All other components within normal limits  COMPREHENSIVE METABOLIC PANEL - Abnormal; Notable for the following components:   Glucose, Bld 116 (*)    BUN 27 (*)    Creatinine, Ser 1.03 (*)    All other components within normal limits  LIPASE, BLOOD     EKG     RADIOLOGY  CT renal stone study: I independently viewed and interpreted the images; there is a 2 mm left UVJ stone   PROCEDURES:  Critical Care performed: No  Procedures   MEDICATIONS ORDERED IN ED: Medications  cefTRIAXone (ROCEPHIN) 1 g in sodium chloride 0.9 % 100 mL IVPB (1 g Intravenous New Bag/Given 09/28/21 1059)  ketorolac (TORADOL) 15 MG/ML injection 15 mg (15 mg Intravenous Given 09/28/21 0909)  ondansetron (ZOFRAN) injection 4 mg (4 mg Intravenous Given 09/28/21 0908)     IMPRESSION / MDM /  ASSESSMENT AND PLAN / ED COURSE  I reviewed the triage vital signs and the nursing notes.  61 year old female with PMH as noted above presents with acute onset of left flank pain and nausea as well as some dysuria this morning.  I reviewed the past medical records.  The patient's most recent medical encounter was an outside ED visit at [facility] for an [place] on 8/8.  She follows with Dr. Posey Pronto from rheumatology and was supposed to see him today.  On exam the patient is well-appearing with normal vital signs.  She has no abdominal tenderness or significant flank/CVA tenderness.  Differential diagnosis includes, but is not limited to, ureteral stone, UTI/pyelonephritis, muscle strain/spasm, radiculopathy.  Patient's presentation is most consistent with acute complicated illness / injury requiring diagnostic workup.  We will obtain labs, urinalysis, CT, and reassess.  ----------------------------------------- 11:00 AM on 09/28/2021 -----------------------------------------  CT shows distal left ureteral 2 mm stone  with mild hydronephrosis.  Urinalysis shows 21-50 RBCs and WBCs with some leukocytes and few bacteria.  This is equivocal for possible UTI.  The other lab work-up is reassuring.  The creatinine is normal and there is no leukocytosis.  Overall presentation is not consistent with significant pyelonephritis or systemic infection.  I consulted Dr. Bernardo Heater from urology given the presence of ureteral stone with possible UTI.  He recommends outpatient treatment with strict return precautions and close clinic follow-up.  On reassessment the patient reports her pain is resolved completely after Toradol.  She feels well and is comfortable going home.  I counseled her on the results of the work-up.  I will prescribe cefpodoxime as well as some analgesia.  I gave strict return precautions and she expressed understanding.   FINAL CLINICAL IMPRESSION(S) / ED DIAGNOSES   Final diagnoses:  Ureteral stone  Urinary tract infection without hematuria, site unspecified     Rx / DC Orders   ED Discharge Orders          Ordered    cefpodoxime (VANTIN) 200 MG tablet  2 times daily        09/28/21 1058    HYDROcodone-acetaminophen (NORCO/VICODIN) 5-325 MG tablet  Every 4 hours PRN        09/28/21 1058             Note:  This document was prepared using Dragon voice recognition software and may include unintentional dictation errors.    Arta Silence, MD 09/28/21 1101

## 2021-09-30 ENCOUNTER — Ambulatory Visit
Admit: 2021-09-30 | Payer: BLUE CROSS/BLUE SHIELD | Attending: Rehabilitative and Restorative Service Providers" | Primary: Rehabilitative and Restorative Service Providers"

## 2021-09-30 ENCOUNTER — Ambulatory Visit
Admit: 2021-09-30 | Discharge: 2021-10-29 | Payer: BLUE CROSS/BLUE SHIELD | Attending: Rehabilitative and Restorative Service Providers" | Primary: Rehabilitative and Restorative Service Providers"

## 2021-10-01 ENCOUNTER — Ambulatory Visit: Admit: 2021-10-01 | Discharge: 2021-10-02 | Payer: BLUE CROSS/BLUE SHIELD

## 2021-10-26 DIAGNOSIS — R2243 Localized swelling, mass and lump, lower limb, bilateral: Principal | ICD-10-CM

## 2021-11-01 ENCOUNTER — Ambulatory Visit
Admit: 2021-11-01 | Payer: BLUE CROSS/BLUE SHIELD | Attending: Rehabilitative and Restorative Service Providers" | Primary: Rehabilitative and Restorative Service Providers"

## 2021-11-11 ENCOUNTER — Ambulatory Visit: Admit: 2021-11-11 | Discharge: 2021-11-12 | Payer: BLUE CROSS/BLUE SHIELD

## 2021-11-18 ENCOUNTER — Ambulatory Visit: Admit: 2021-11-18 | Discharge: 2021-11-19 | Payer: BLUE CROSS/BLUE SHIELD

## 2021-11-25 ENCOUNTER — Ambulatory Visit: Admit: 2021-11-25 | Discharge: 2021-11-26 | Payer: BLUE CROSS/BLUE SHIELD

## 2021-11-30 ENCOUNTER — Ambulatory Visit
Admit: 2021-11-30 | Discharge: 2021-12-01 | Payer: BLUE CROSS/BLUE SHIELD | Attending: Rehabilitative and Restorative Service Providers" | Primary: Rehabilitative and Restorative Service Providers"

## 2021-12-22 ENCOUNTER — Ambulatory Visit: Payer: Medicaid Other

## 2021-12-24 ENCOUNTER — Ambulatory Visit: Admit: 2021-12-24 | Discharge: 2021-12-25 | Payer: BLUE CROSS/BLUE SHIELD

## 2021-12-24 ENCOUNTER — Ambulatory Visit: Admit: 2021-12-24 | Discharge: 2021-12-25 | Payer: BLUE CROSS/BLUE SHIELD | Attending: Family | Primary: Family

## 2022-02-01 ENCOUNTER — Ambulatory Visit: Admit: 2022-02-01 | Discharge: 2022-02-02 | Payer: BLUE CROSS/BLUE SHIELD

## 2022-02-09 ENCOUNTER — Ambulatory Visit: Admit: 2022-02-09 | Discharge: 2022-02-10 | Payer: BLUE CROSS/BLUE SHIELD | Attending: Family | Primary: Family

## 2022-02-21 DIAGNOSIS — I890 Lymphedema, not elsewhere classified: Principal | ICD-10-CM

## 2022-03-01 ENCOUNTER — Ambulatory Visit
Admit: 2022-03-01 | Discharge: 2022-03-02 | Payer: BLUE CROSS/BLUE SHIELD | Attending: Rehabilitative and Restorative Service Providers" | Primary: Rehabilitative and Restorative Service Providers"

## 2022-03-03 IMAGING — MG DIGITAL SCREENING BILAT W/ TOMO W/ CAD
8 series · 8 of 24 positions shown · non-contrast
Comparison: Previous exam(s).

CLINICAL DATA: Screening.

EXAM:
DIGITAL SCREENING BILATERAL MAMMOGRAM WITH TOMO AND CAD

[L CC synth-2D]
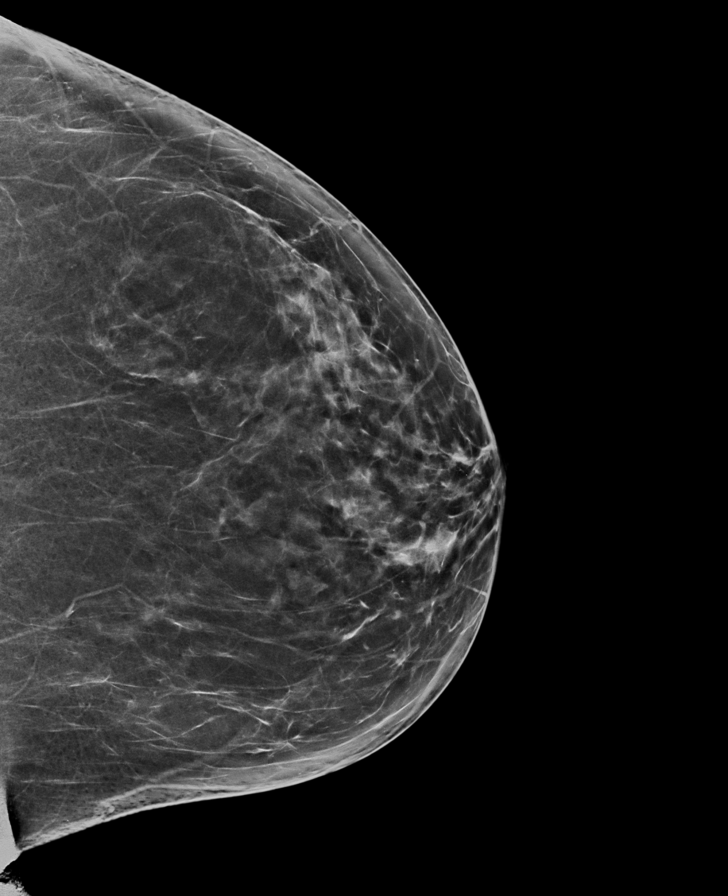

[L MLO synth-2D]
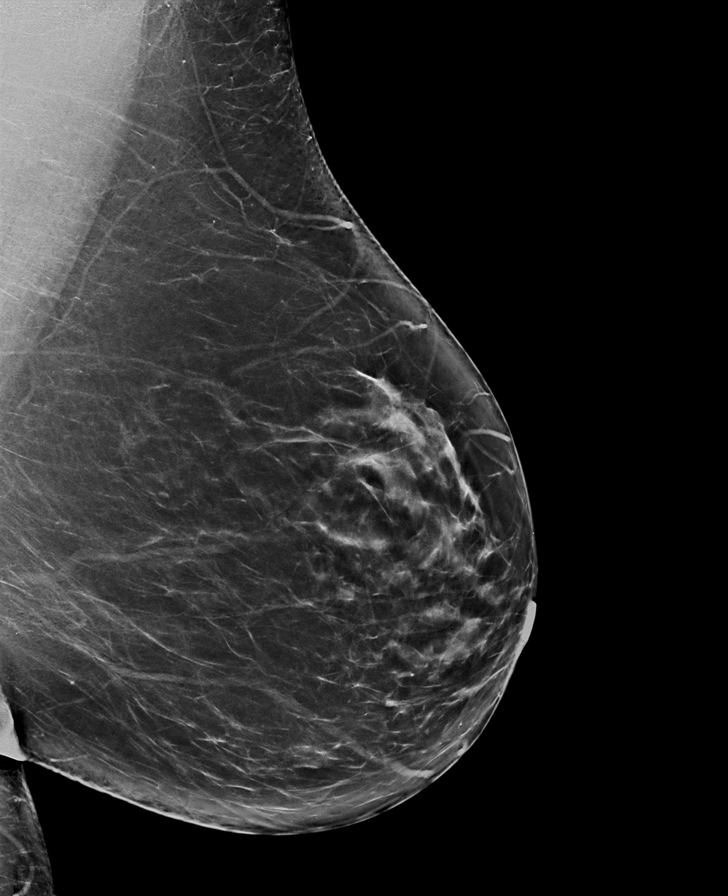

[R MLO synth-2D]
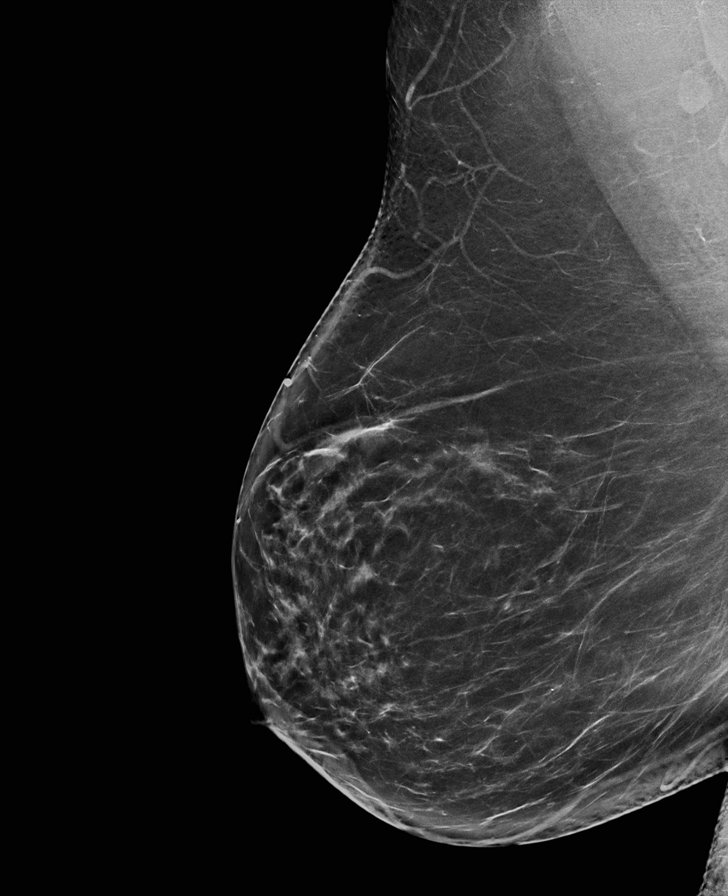

[R CC synth-2D]
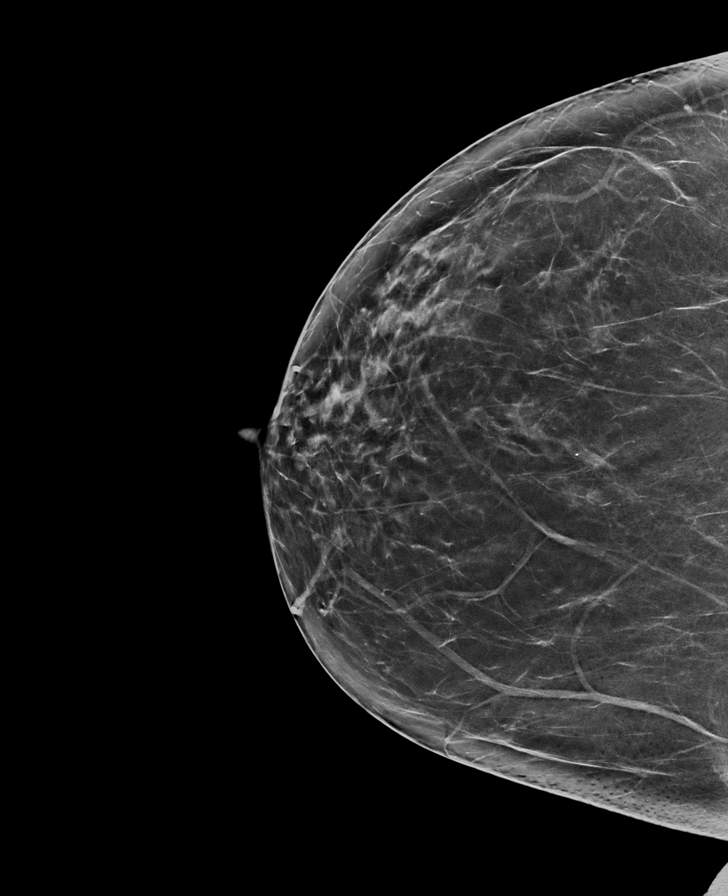

[R MLO tomo · tomo slice 45/90.0]
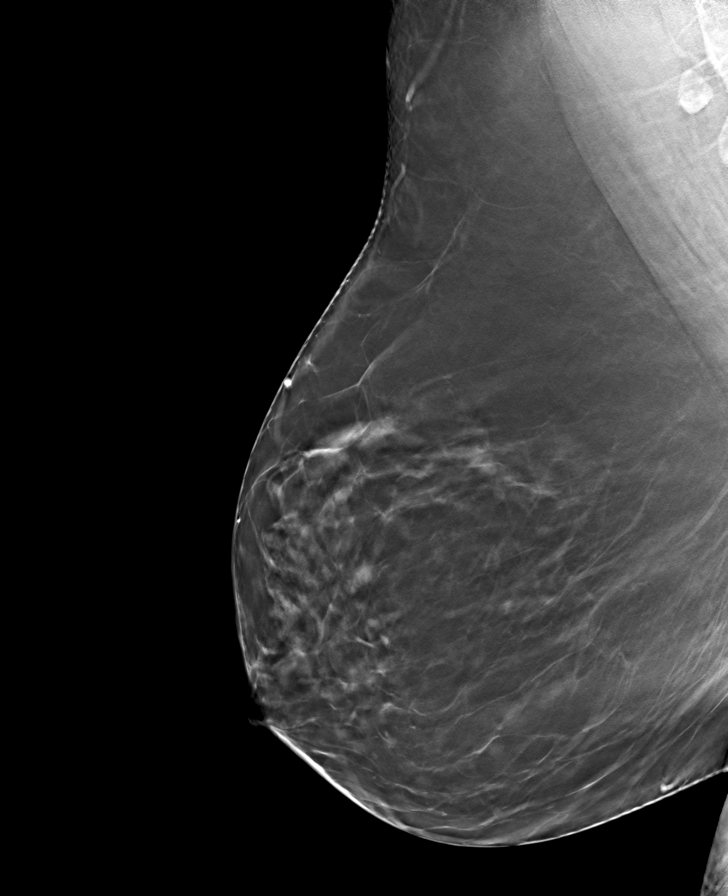

[L CC tomo · tomo slice 38/75.0]
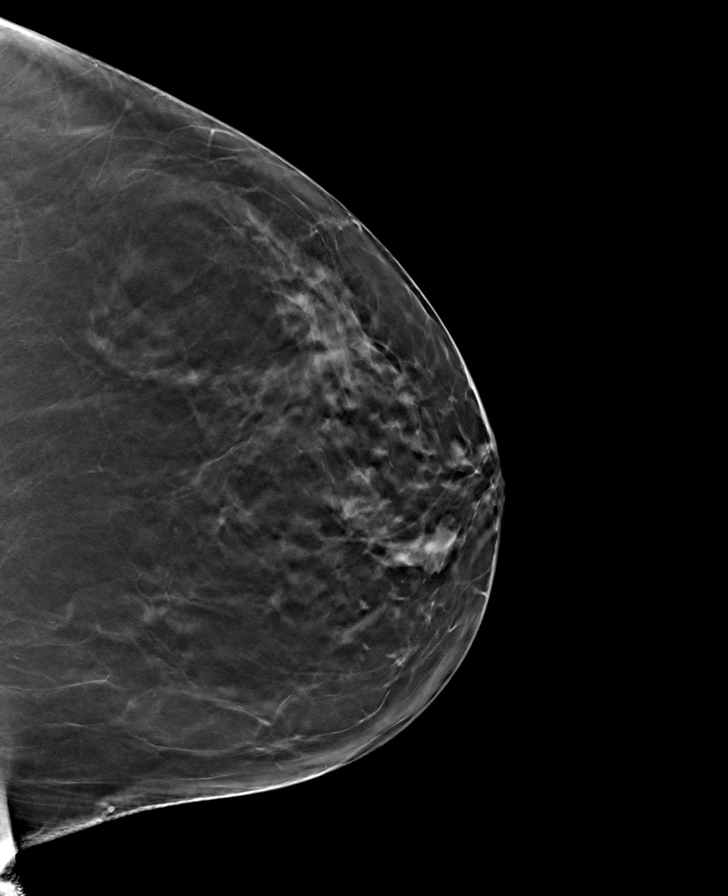

[L MLO tomo · tomo slice 45/88.0]
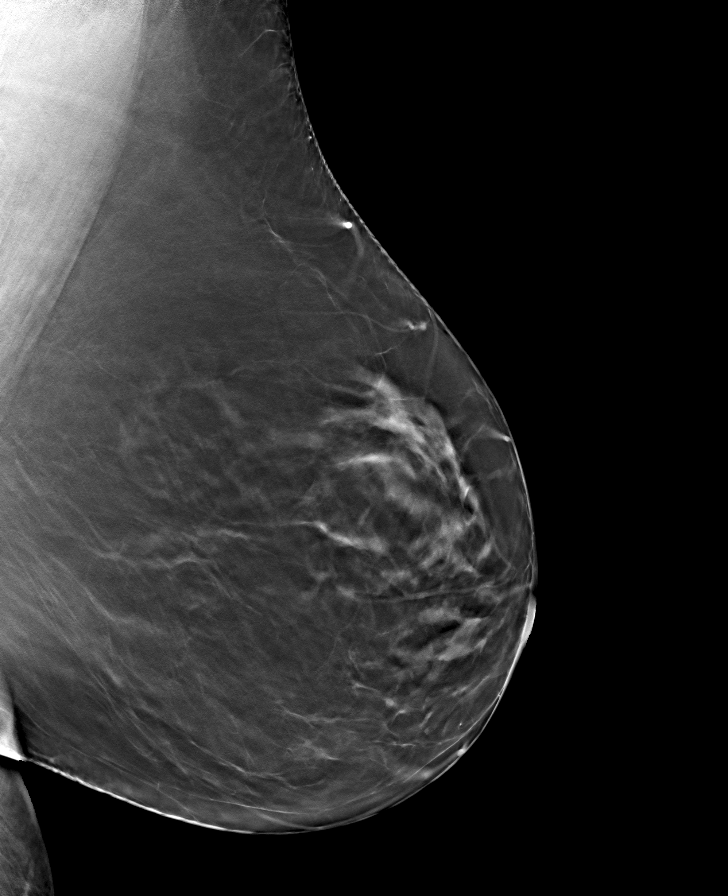

[R CC tomo · tomo slice 37/74.0]
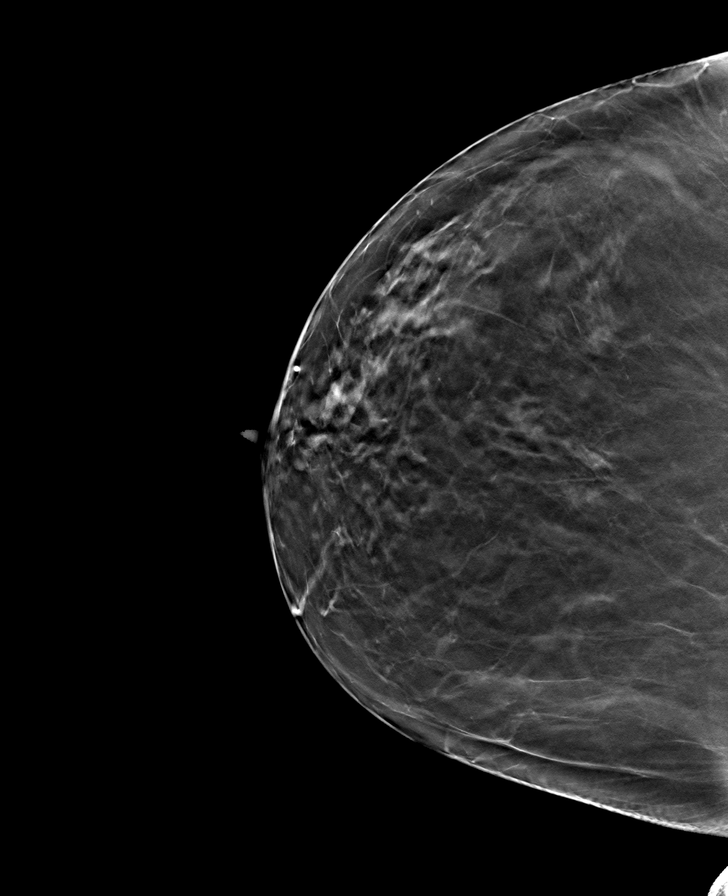

[8 of 24 positions shown; findings below may reference images not displayed]

ACR Breast Density Category b: There are scattered areas of
fibroglandular density.
FINDINGS: There are no findings suspicious for malignancy. Images were
processed with CAD.
IMPRESSION: No mammographic evidence of malignancy. A result letter of this
screening mammogram will be mailed directly to the patient.

RECOMMENDATION:
Screening mammogram in one year. (Code:CN-U-775)

BI-RADS CATEGORY  1: Negative.

## 2022-03-07 ENCOUNTER — Ambulatory Visit: Payer: Medicaid Other | Attending: Family Medicine

## 2022-03-07 DIAGNOSIS — M25512 Pain in left shoulder: Secondary | ICD-10-CM | POA: Insufficient documentation

## 2022-03-07 DIAGNOSIS — S46912A Strain of unspecified muscle, fascia and tendon at shoulder and upper arm level, left arm, initial encounter: Secondary | ICD-10-CM | POA: Diagnosis present

## 2022-03-07 DIAGNOSIS — G8911 Acute pain due to trauma: Secondary | ICD-10-CM | POA: Diagnosis present
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
## 2022-03-09 ENCOUNTER — Ambulatory Visit: Payer: Medicaid Other

## 2022-03-09 DIAGNOSIS — M25512 Pain in left shoulder: Secondary | ICD-10-CM

## 2022-03-09 DIAGNOSIS — G8911 Acute pain due to trauma: Secondary | ICD-10-CM
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
## 2022-03-11 ENCOUNTER — Ambulatory Visit: Admit: 2022-03-11 | Discharge: 2022-03-12 | Payer: BLUE CROSS/BLUE SHIELD

## 2022-03-14 ENCOUNTER — Ambulatory Visit: Payer: Medicaid Other | Attending: Family Medicine

## 2022-03-14 DIAGNOSIS — M25512 Pain in left shoulder: Secondary | ICD-10-CM | POA: Diagnosis present

## 2022-03-14 DIAGNOSIS — G8911 Acute pain due to trauma: Secondary | ICD-10-CM | POA: Diagnosis present

## 2022-03-14 DIAGNOSIS — R928 Other abnormal and inconclusive findings on diagnostic imaging of breast: Principal | ICD-10-CM
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
## 2022-03-16 ENCOUNTER — Ambulatory Visit: Payer: Medicaid Other

## 2022-03-16 DIAGNOSIS — M25512 Pain in left shoulder: Secondary | ICD-10-CM

## 2022-03-16 DIAGNOSIS — G8911 Acute pain due to trauma: Secondary | ICD-10-CM
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
## 2022-03-21 ENCOUNTER — Ambulatory Visit: Payer: Medicaid Other

## 2022-03-22 ENCOUNTER — Ambulatory Visit
Admit: 2022-03-22 | Payer: BLUE CROSS/BLUE SHIELD | Attending: Rehabilitative and Restorative Service Providers" | Primary: Rehabilitative and Restorative Service Providers"

## 2022-03-23 ENCOUNTER — Ambulatory Visit: Payer: Medicaid Other

## 2022-03-28 ENCOUNTER — Ambulatory Visit: Payer: Medicaid Other

## 2022-03-28 DIAGNOSIS — M25512 Pain in left shoulder: Secondary | ICD-10-CM | POA: Diagnosis not present

## 2022-03-28 DIAGNOSIS — G8911 Acute pain due to trauma: Secondary | ICD-10-CM
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
## 2022-03-30 ENCOUNTER — Ambulatory Visit: Payer: Medicaid Other

## 2022-03-30 DIAGNOSIS — M25512 Pain in left shoulder: Secondary | ICD-10-CM | POA: Diagnosis not present
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
## 2022-04-04 ENCOUNTER — Ambulatory Visit: Payer: Medicaid Other

## 2022-04-04 DIAGNOSIS — G8911 Acute pain due to trauma: Secondary | ICD-10-CM

## 2022-04-04 DIAGNOSIS — M25512 Pain in left shoulder: Secondary | ICD-10-CM | POA: Diagnosis not present
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
## 2022-04-25 ENCOUNTER — Ambulatory Visit: Payer: Medicaid Other | Attending: Family Medicine

## 2022-04-25 DIAGNOSIS — G8911 Acute pain due to trauma: Secondary | ICD-10-CM | POA: Diagnosis present

## 2022-04-25 DIAGNOSIS — M25512 Pain in left shoulder: Secondary | ICD-10-CM | POA: Insufficient documentation
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
## 2022-04-26 ENCOUNTER — Ambulatory Visit
Admit: 2022-04-26 | Payer: BLUE CROSS/BLUE SHIELD | Attending: Rehabilitative and Restorative Service Providers" | Primary: Rehabilitative and Restorative Service Providers"

## 2022-05-02 ENCOUNTER — Ambulatory Visit: Payer: Medicaid Other

## 2022-05-02 DIAGNOSIS — G8911 Acute pain due to trauma: Secondary | ICD-10-CM

## 2022-05-02 DIAGNOSIS — M25512 Pain in left shoulder: Secondary | ICD-10-CM | POA: Diagnosis not present
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
## 2022-05-11 ENCOUNTER — Ambulatory Visit: Payer: Medicaid Other | Attending: Family Medicine

## 2022-05-11 DIAGNOSIS — M25512 Pain in left shoulder: Secondary | ICD-10-CM | POA: Diagnosis present

## 2022-05-11 DIAGNOSIS — G8911 Acute pain due to trauma: Secondary | ICD-10-CM | POA: Insufficient documentation
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
## 2022-05-18 ENCOUNTER — Ambulatory Visit: Payer: Medicaid Other

## 2022-06-16 ENCOUNTER — Ambulatory Visit: Admit: 2022-06-16 | Discharge: 2022-06-16 | Payer: BLUE CROSS/BLUE SHIELD

## 2022-06-16 DIAGNOSIS — R928 Other abnormal and inconclusive findings on diagnostic imaging of breast: Principal | ICD-10-CM

## 2022-06-20 ENCOUNTER — Ambulatory Visit: Admit: 2022-06-20 | Discharge: 2022-06-21 | Payer: BLUE CROSS/BLUE SHIELD

## 2022-07-04 ENCOUNTER — Ambulatory Visit: Admit: 2022-07-04 | Discharge: 2022-07-05 | Payer: BLUE CROSS/BLUE SHIELD

## 2022-07-18 ENCOUNTER — Ambulatory Visit: Admit: 2022-07-18 | Discharge: 2022-07-19 | Payer: BLUE CROSS/BLUE SHIELD

## 2022-07-25 ENCOUNTER — Ambulatory Visit: Admit: 2022-07-25 | Discharge: 2022-07-26 | Payer: BLUE CROSS/BLUE SHIELD

## 2022-09-19 ENCOUNTER — Ambulatory Visit: Admit: 2022-09-19 | Discharge: 2022-09-19 | Payer: BLUE CROSS/BLUE SHIELD

## 2023-01-16 DIAGNOSIS — R2243 Localized swelling, mass and lump, lower limb, bilateral: Principal | ICD-10-CM

## 2023-02-09 ENCOUNTER — Ambulatory Visit
Admit: 2023-02-09 | Discharge: 2023-02-10 | Payer: BLUE CROSS/BLUE SHIELD | Attending: Rehabilitative and Restorative Service Providers" | Primary: Rehabilitative and Restorative Service Providers"

## 2023-08-09 ENCOUNTER — Ambulatory Visit
Admission: RE | Admit: 2023-08-09 | Discharge: 2023-08-09 | Disposition: A | Discharge status: Home / Self Care | Source: Ambulatory Visit

## 2023-08-09 VITALS — BP 151/90 | HR 88 | Temp 98.1°F | Resp 16

## 2023-08-09 DIAGNOSIS — M65969 Unspecified synovitis and tenosynovitis, unspecified lower leg: Secondary | ICD-10-CM

## 2023-08-09 MED ORDER — METHYLPREDNISOLONE 4 MG PO TBPK: ORAL_TABLET | ORAL | 0 refills | Status: AC

## 2023-08-09 MED ORDER — DEXAMETHASONE SODIUM PHOSPHATE 10 MG/ML IJ SOLN
10.0000 mg | Freq: Once | INTRAMUSCULAR | Status: AC
  Administered 2023-08-09: 10 mg via INTRAMUSCULAR

## 2023-08-09 NOTE — ED Provider Notes (Signed)
 [facility]    CSN: [id number] Arrival date & time: 08/09/23  1500      History   Chief Complaint Chief Complaint  Patient presents with   Leg Pain    Left leg pain in lower calf - Entered by patient    HPI Tracy Anthony is a 63 y.o. female.   HPI  63 year old female with past medical history significant for lymphedema, restless leg syndrome, fibromyalgia, osteoarthritis, and rheumatoid arthritis presents for evaluation of pain in the front of her left lower extremity that has been present for the last month.  This morning when she woke up the pain was much worse.  She denies any precipitating events or injury.  History reviewed. No pertinent past medical history.  There are no active problems to display for this patient.   History reviewed. No pertinent surgical history.  OB History   No obstetric history on file.      Home Medications    Prior to Admission medications   Medication Sig Start Date End Date Taking? Authorizing Provider  clotrimazole (LOTRIMIN) 1 % cream  12/07/21  Yes [provider]  DULoxetine (CYMBALTA) 20 MG capsule Take 20 mg by mouth 2 (two) times daily. 07/20/23  Yes [provider]  fluticasone (FLONASE) 50 MCG/ACT nasal spray SMARTSIG:1 Spray(s) Both Nares Daily PRN 05/08/23  Yes [provider]  methylPREDNISolone  (MEDROL  DOSEPAK) 4 MG TBPK tablet Take according to the package insert. 08/09/23  Yes Bernardino Ditch, NP  triamcinolone cream (KENALOG) 0.1 % Apply topically. 04/13/20  Yes [provider]  VOLTAREN ARTHRITIS PAIN 1 % GEL SMARTSIG:2 Gram(s) Topical 3 Times Daily PRN 03/28/23  Yes [provider]  cetirizine (ZYRTEC) 10 MG tablet Take 10 mg by mouth daily.    [provider]  Cholecalciferol (VITAMIN D-1000 MAX ST) 25 MCG (1000 UT) tablet Take by mouth.    [provider]  hydrocortisone 2.5 % ointment Apply topically.    [provider]  hydroxychloroquine  (PLAQUENIL) 200 MG tablet Take 200 mg by mouth 2 (two) times daily.    [provider]  omeprazole (PRILOSEC) 40 MG capsule Take 40 mg by mouth daily.    [provider]    Family History Family History  Problem Relation Age of Onset   Breast cancer Maternal Aunt    Breast cancer Paternal Aunt     Social History Social History   Tobacco Use   Smoking status: Former    Types: Cigarettes   Smokeless tobacco: Never  Vaping Use   Vaping status: Never Used  Substance Use Topics   Alcohol use: Not Currently   Drug use: Never     Allergies   Gabapentin   Review of Systems Review of Systems  Musculoskeletal:  Positive for myalgias.  Skin:  Negative for color change.  Neurological:  Negative for weakness and numbness.     Physical Exam Triage Vital Signs ED Triage Vitals [08/09/23 1546]  Encounter Vitals Group     BP      Girls Systolic BP Percentile      Girls Diastolic BP Percentile      Boys Systolic BP Percentile      Boys Diastolic BP Percentile      Pulse      Resp      Temp      Temp src      SpO2      Weight      Height  Head Circumference      Peak Flow      Pain Score 8     Pain Loc      Pain Education      Exclude from Growth Chart    No data found.  Updated Vital Signs BP (!) 151/90 (BP Location: Right Wrist)   Pulse 88   Temp 98.1 F (36.7 C) (Oral)   Resp 16   SpO2 95%   Visual Acuity Right Eye Distance:   Left Eye Distance:   Bilateral Distance:    Right Eye Near:   Left Eye Near:    Bilateral Near:     Physical Exam Vitals and nursing note reviewed.  Constitutional:      Appearance: Normal appearance. She is not ill-appearing.  HENT:     Head: Normocephalic and atraumatic.  Musculoskeletal:        General: Tenderness present. No signs of injury.  Skin:    General: Skin is warm and dry.     Capillary Refill: Capillary refill takes less than 2 seconds.     Findings: No bruising or erythema.   Neurological:     General: No focal deficit present.     Mental Status: She is alert and oriented to person, place, and time.      UC Treatments / Results  Labs (all labs ordered are listed, but only abnormal results are displayed) Labs Reviewed - No data to display  EKG   Radiology No results found.  Procedures Procedures (including critical care time)  Medications Ordered in UC Medications  dexamethasone  (DECADRON ) injection 10 mg (has no administration in time range)    Initial Impression / Assessment and Plan / UC Course  I have reviewed the triage vital signs and the nursing notes.  Pertinent labs & imaging results that were available during my care of the patient were reviewed by me and considered in my medical decision making (see chart for details).   Patient is a pleasant, nontoxic-appearing 63 year old female presenting for evaluation of pain in the anterior lateral aspect of her left lower extremity that has been going on for last month.  The patient describes pain is along the path of the tibialis anterior.  The pain increases with weightbearing and also with dorsiflexion of her foot.  I can reproduce the pain with palpation.  Patient does have a history of lymphedema, fibromyalgia, and restless leg syndrome.  She uses a lymphedema machine twice daily and she reports that using this machine does aggravate the pain.  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  Her cap refill is less than 3 seconds and her DP and PT pulses are 2+.  I will discharge her home with a diagnosis of tibialis anterior tenosynovitis and start her on a Medrol  Dosepak to help with inflammation.  I will have staff administer 10 mg of IM Decadron  here in clinic.   Final Clinical Impressions(s) / UC Diagnoses   Final diagnoses:  Tibialis anterior tenosynovitis     Discharge Instructions      Beginning tomorrow morning take  the Medrol  Dosepak according to package instructions.  You will take it over the next 6 days to help decrease inflammation of your tibialis anterior.  Rest your left lower extremity is much as possible.  You may apply ice, or moist heat, to your leg for 20 minutes at a time, 2-3 times a day, to help with pain  and inflammation.  Wearing compression hose or compression socks can help with pain relief.  Follow the rehabilitation exercises given your discharge instructions.  If your symptoms do not improve I recommend that you follow-up with orthopedics such as [facility] here in [place] or in [place].       ED Prescriptions     Medication Sig Dispense Auth. Provider   methylPREDNISolone  (MEDROL  DOSEPAK) 4 MG TBPK tablet Take according to the package insert. 1 each Bernardino Ditch, NP      PDMP not reviewed this encounter.   Bernardino Ditch, NP 08/09/23 [phone]

## 2023-08-09 NOTE — ED Triage Notes (Signed)
 Pt presents with pain in her left leg mainly near her shin for 1 month. The pain became worse today. She denies any injury.

## 2023-08-09 NOTE — Discharge Instructions (Addendum)
 Beginning tomorrow morning take the Medrol  Dosepak according to package instructions.  You will take it over the next 6 days to help decrease inflammation of your tibialis anterior.  Rest your left lower extremity is much as possible.  You may apply ice, or moist heat, to your leg for 20 minutes at a time, 2-3 times a day, to help with pain and inflammation.  Wearing compression hose or compression socks can help with pain relief.  Follow the rehabilitation exercises given your discharge instructions.  If your symptoms do not improve I recommend that you follow-up with orthopedics such as [facility] here in [place] or in [place].

## 2023-09-12 ENCOUNTER — Ambulatory Visit: Admit: 2023-09-12 | Discharge: 2023-09-13 | Payer: Medicaid (Managed Care)

## 2023-09-12 DIAGNOSIS — M1712 Unilateral primary osteoarthritis, left knee: Principal | ICD-10-CM

## 2024-02-09 ENCOUNTER — Inpatient Hospital Stay: Admit: 2024-02-09 | Discharge: 2024-02-09 | Payer: Medicaid (Managed Care)
